# Patient Record
Sex: Female | Born: 2005 | Race: Black or African American | Hispanic: No | Marital: Single | Smoking: Never smoker
Health system: Southern US, Community
[De-identification: ages and names within clinical notes are randomized; demographics above are authoritative.]

## PROBLEM LIST (undated history)

## (undated) DIAGNOSIS — J45909 Unspecified asthma, uncomplicated: Secondary | ICD-10-CM

## (undated) DIAGNOSIS — E669 Obesity, unspecified: Secondary | ICD-10-CM

## (undated) DIAGNOSIS — T7840XA Allergy, unspecified, initial encounter: Secondary | ICD-10-CM

## (undated) HISTORY — DX: Allergy, unspecified, initial encounter: T78.40XA

## (undated) HISTORY — DX: Unspecified asthma, uncomplicated: J45.909

---
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
## 2023-11-10 DIAGNOSIS — M9915 Subluxation complex (vertebral) of pelvic region: Secondary | ICD-10-CM | POA: Diagnosis not present

## 2023-11-10 DIAGNOSIS — M9914 Subluxation complex (vertebral) of sacral region: Secondary | ICD-10-CM | POA: Diagnosis not present

## 2023-11-10 DIAGNOSIS — M9913 Subluxation complex (vertebral) of lumbar region: Secondary | ICD-10-CM | POA: Diagnosis not present
